# Patient Record
Sex: Female | Born: 1965 | Race: Black or African American | Hispanic: Yes | Marital: Single | Smoking: Current every day smoker
Health system: Southern US, Community
[De-identification: ages and names within clinical notes are randomized; demographics above are authoritative.]

## PROBLEM LIST (undated history)

## (undated) DIAGNOSIS — K219 Gastro-esophageal reflux disease without esophagitis: Secondary | ICD-10-CM

## (undated) DIAGNOSIS — M1990 Unspecified osteoarthritis, unspecified site: Secondary | ICD-10-CM

## (undated) DIAGNOSIS — M797 Fibromyalgia: Secondary | ICD-10-CM

## (undated) DIAGNOSIS — M329 Systemic lupus erythematosus, unspecified: Secondary | ICD-10-CM

## (undated) DIAGNOSIS — D649 Anemia, unspecified: Secondary | ICD-10-CM

## (undated) DIAGNOSIS — D259 Leiomyoma of uterus, unspecified: Secondary | ICD-10-CM

## (undated) DIAGNOSIS — K759 Inflammatory liver disease, unspecified: Secondary | ICD-10-CM

## (undated) HISTORY — PX: IUD REMOVAL: SHX5392

## (undated) HISTORY — PX: PILONIDAL CYST EXCISION: SHX744

## (undated) HISTORY — PX: CHOLECYSTECTOMY: SHX55

---

## 2007-07-05 ENCOUNTER — Ambulatory Visit: Payer: Self-pay | Admitting: Emergency Medicine

## 2007-09-14 ENCOUNTER — Ambulatory Visit: Payer: Self-pay | Admitting: Internal Medicine

## 2007-09-19 ENCOUNTER — Ambulatory Visit: Payer: Self-pay | Admitting: Internal Medicine

## 2010-04-07 ENCOUNTER — Emergency Department: Payer: Self-pay | Admitting: Emergency Medicine

## 2010-09-19 ENCOUNTER — Emergency Department: Payer: Self-pay | Admitting: Unknown Physician Specialty

## 2010-12-22 ENCOUNTER — Ambulatory Visit: Payer: Self-pay | Admitting: Internal Medicine

## 2011-06-09 ENCOUNTER — Emergency Department: Payer: Self-pay | Admitting: Internal Medicine

## 2012-01-28 ENCOUNTER — Ambulatory Visit: Payer: Self-pay

## 2012-08-30 ENCOUNTER — Ambulatory Visit: Payer: Self-pay | Admitting: Family Medicine

## 2014-03-04 ENCOUNTER — Ambulatory Visit: Payer: Self-pay

## 2014-08-27 ENCOUNTER — Emergency Department: Payer: Self-pay | Admitting: Internal Medicine

## 2015-08-03 IMAGING — CR DG KNEE COMPLETE 4+V*L*
1 series · 4 of 4 positions shown · non-contrast
Comparison: None.

CLINICAL DATA: 48-year-old female status post fall while walking up
steps with left knee pain. Initial encounter.

EXAM:
LEFT KNEE - COMPLETE 4+ VIEW

[Series 1: dxr knee lt comp with obliques · 0.14mm/px · 4 of 4 slices shown]
[im 1/4]
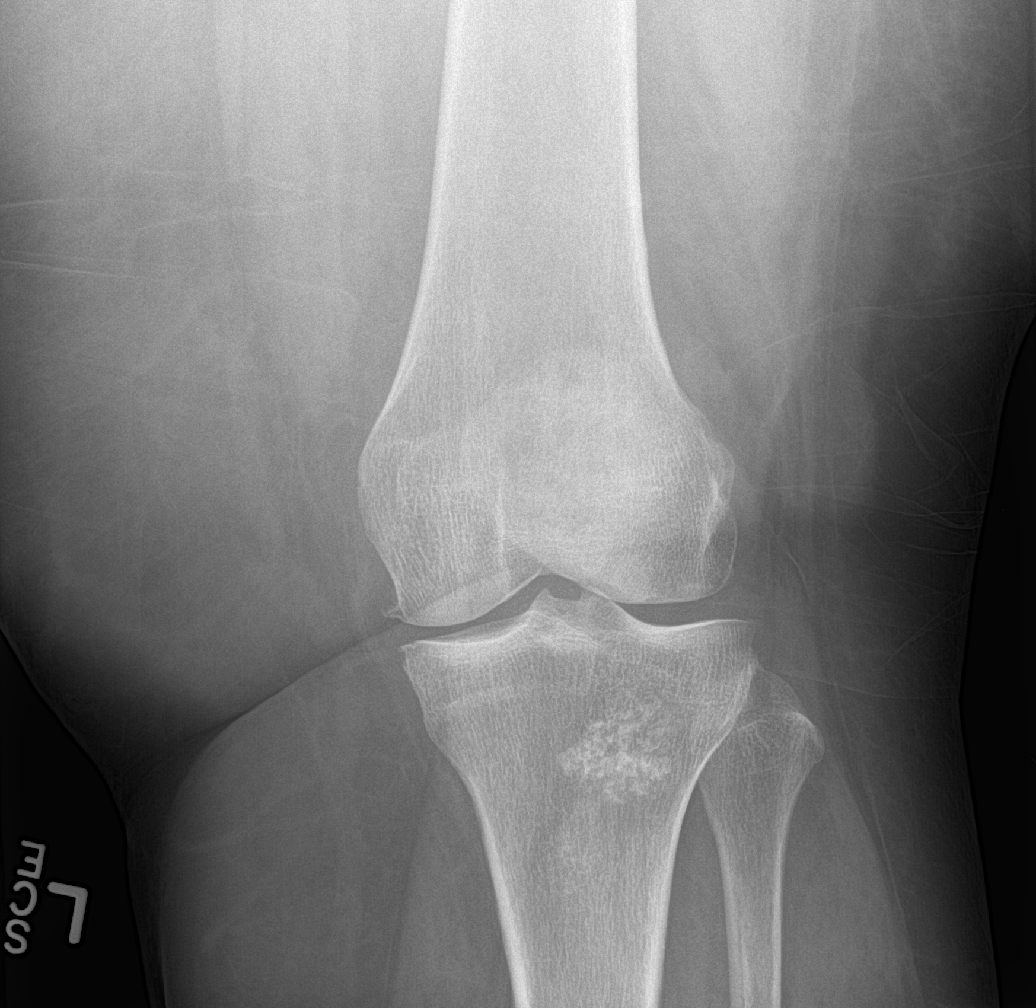
[im 2/4]
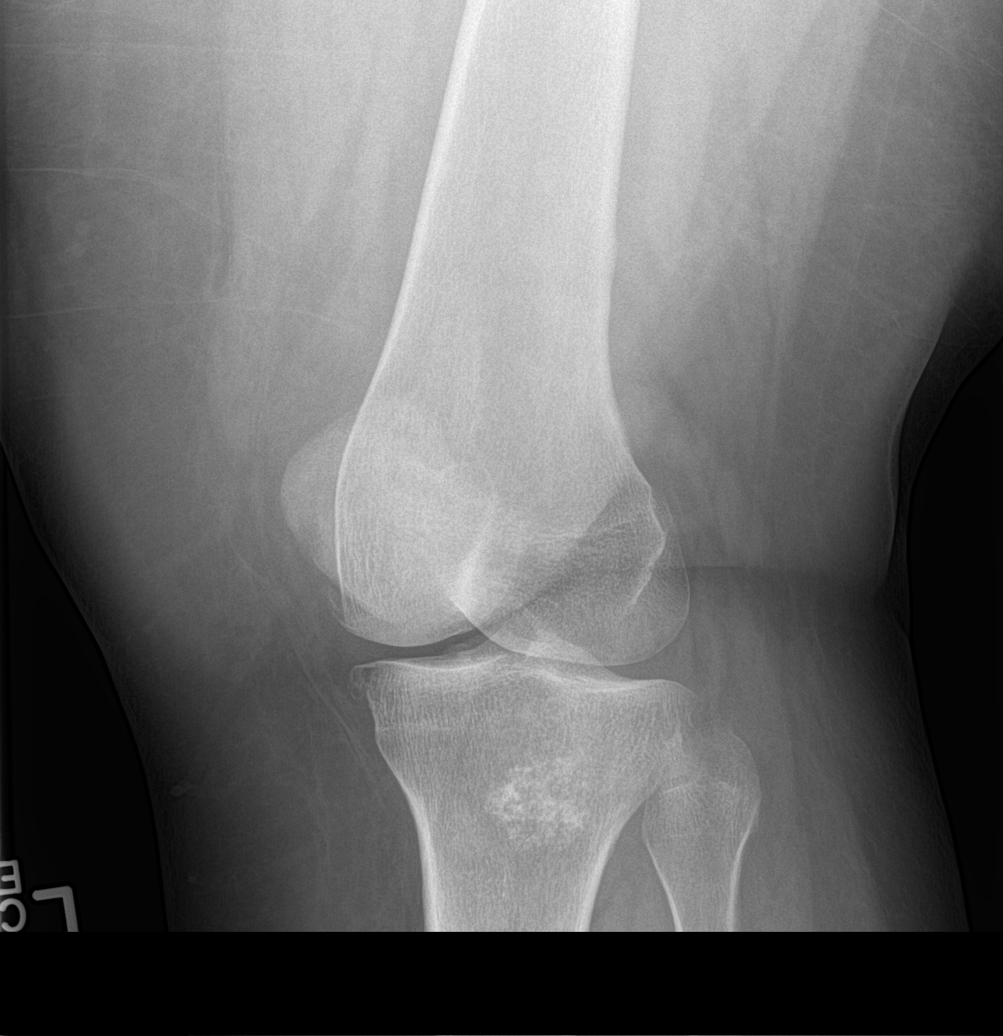
[im 3/4]
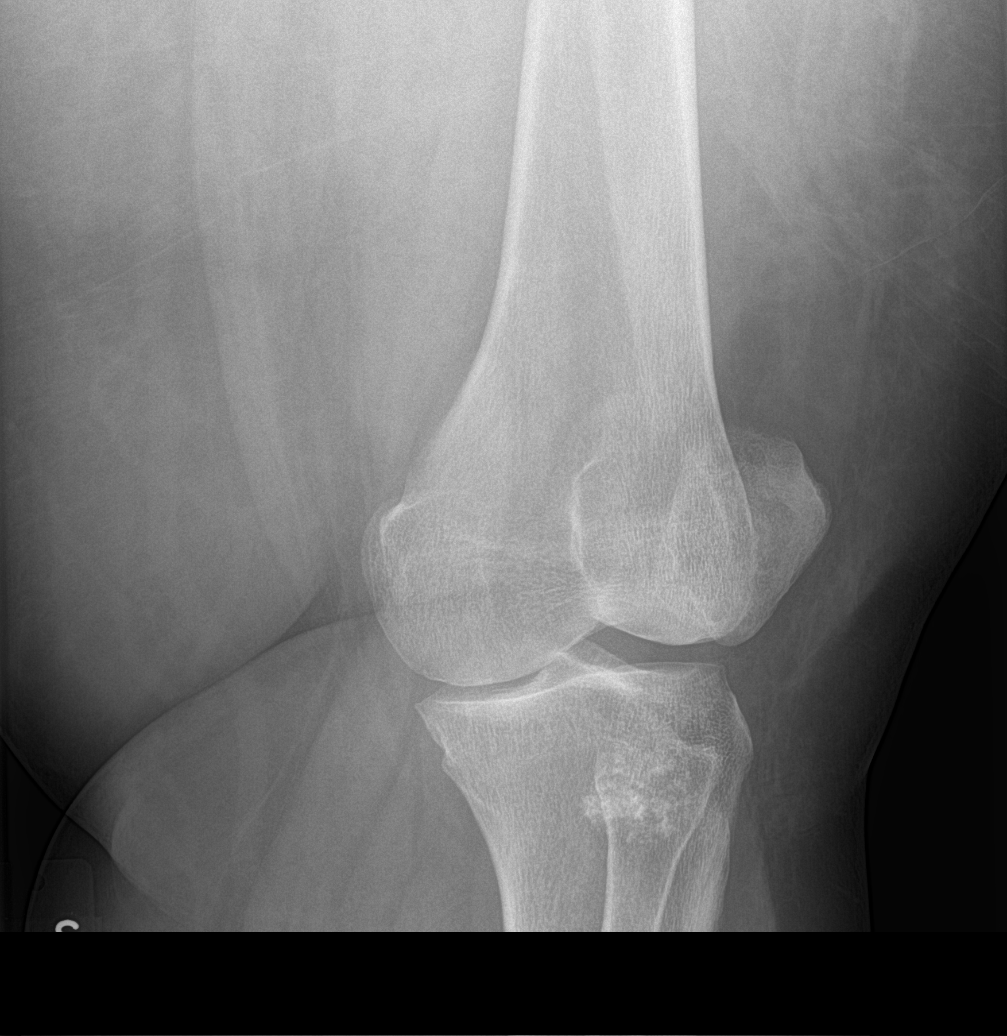
[im 4/4]
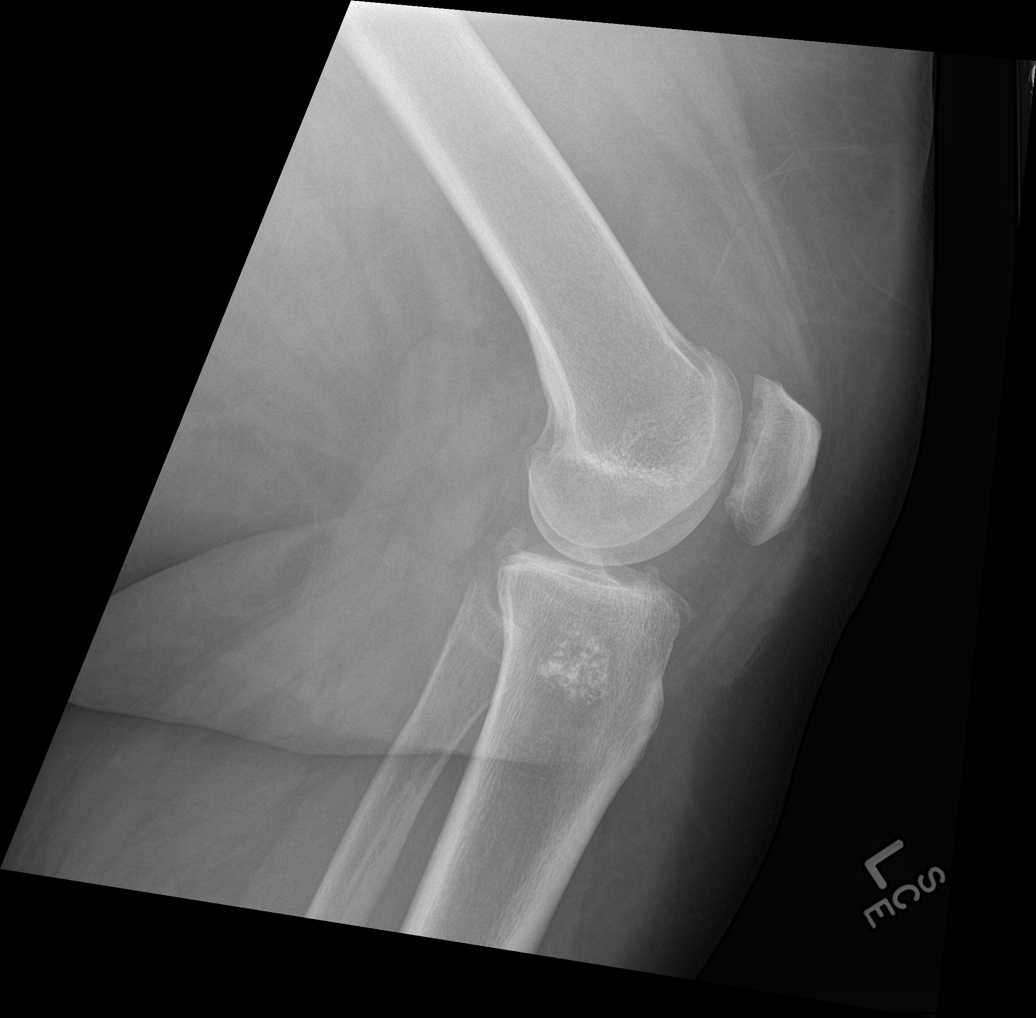

[4 of 4 positions shown; findings below may reference images not displayed]

FINDINGS: Large body habitus. Chondroid appearing sclerosis in the left tibia
meta diaphysis, benign appearance. Otherwise normal bone
mineralization. Patella intact with mild degenerative spurring. No
definite joint effusion. Medial compartment joint space loss with
moderate degenerative spurring. No acute fracture or dislocation.
IMPRESSION: 1.  No acute fracture or dislocation identified about the left knee.
2. Medial and patellofemoral compartment joint degeneration.
3. Benign appearing sclerosis in the proximal tibia probable
enchondroma.

## 2015-11-16 DIAGNOSIS — IMO0002 Reserved for concepts with insufficient information to code with codable children: Secondary | ICD-10-CM

## 2015-11-16 DIAGNOSIS — M329 Systemic lupus erythematosus, unspecified: Secondary | ICD-10-CM

## 2015-11-16 DIAGNOSIS — D649 Anemia, unspecified: Secondary | ICD-10-CM

## 2015-11-16 HISTORY — DX: Systemic lupus erythematosus, unspecified: M32.9

## 2015-11-16 HISTORY — DX: Anemia, unspecified: D64.9

## 2015-11-16 HISTORY — DX: Reserved for concepts with insufficient information to code with codable children: IMO0002

## 2016-01-16 ENCOUNTER — Inpatient Hospital Stay: Admission: RE | Admit: 2016-01-16 | Payer: Self-pay | Source: Ambulatory Visit

## 2016-01-16 ENCOUNTER — Encounter: Payer: Self-pay | Admitting: *Deleted

## 2016-01-16 NOTE — Patient Instructions (Signed)
  Your procedure is scheduled on: 01-20-16 Report to [place] To find out your arrival time please call [phone] between 1PM - 3PM on 01-19-16  Remember: Instructions that are not followed completely may result in serious medical risk, up to and including death, or upon the discretion of your surgeon and anesthesiologist your surgery may need to be rescheduled.    _X___ 1. Do not eat food or drink liquids after midnight. No gum chewing or hard candies.     _X___ 2. No Alcohol for 24 hours before or after surgery.   ____ 3. Bring all medications with you on the day of surgery if instructed.    ____ 4. Notify your doctor if there is any change in your medical condition     (cold, fever, infections).     Do not wear jewelry, make-up, hairpins, clips or nail polish.  Do not wear lotions, powders, or perfumes. You may wear deodorant.  Do not shave 48 hours prior to surgery. Men may shave face and neck.  Do not bring valuables to the hospital.    [facility] is not responsible for any belongings or valuables.               Contacts, dentures or bridgework may not be worn into surgery.  Leave your suitcase in the car. After surgery it may be brought to your room.  For patients admitted to the hospital, discharge time is determined by your treatment team.   Patients discharged the day of surgery will not be allowed to drive home.   Please read over the following fact sheets that you were given:     _X___ Take these medicines the morning of surgery with A SIP OF WATER:    1. CYMBALTA  2. OMEPRAZOLE  3. TAKE AN EXTRA OMEPRAZOLE ON Monday NIGHT (01-19-16) BEFORE BED  4.  5.  6.  ____ Fleet Enema (as directed)   ____ Use CHG Soap as directed  ____ Use inhalers on the day of surgery  ____ Stop metformin 2 days prior to surgery    ____ Take 1/2 of usual insulin dose the night before surgery and none on the morning of surgery.   ____ Stop  Coumadin/Plavix/aspirin-N/A  ____ Stop Anti-inflammatories-NO NSAIDS OR ASA PRODUCTS-TYLENOL OK TO TAKE   ____ Stop supplements until after surgery.    ____ Bring C-Pap to the hospital.

## 2016-01-19 NOTE — Pre-Procedure Instructions (Signed)
Language Race / Ethnicity   [address] [place], [place] [address]  [id number] (Home)  English (Preferred) Black or African American / Not Hispanic or Latino  Reason for Referral Incoming Referral Reason Specialty Diagnoses / Procedures Referred By Contact Referred To Contact  [place] Access / [place] (Routine)    Procedures  Exercise Treadmill Test  Cay Schillings, FNP  [address]  [place], [place] [address]  Phone: [phone]  Fax: [phone]      Outgoing Referral Reason Specialty Diagnoses / Procedures Referred By Contact Referred To Contact  [place] Access / [place] (Routine)    Procedures  Exercise Treadmill Test  Cay Schillings, [place]  [place], [place] [address]  Phone: [phone]  Fax: [place] / [place] (Routine)    Procedures  Exercise Treadmill Test  Cay Schillings, [place]  [place], [place] [address]  Phone: [phone]  Fax: [phone]     Encounter Details Date Type Department Care Team Description  06/25/2015 [facility] Encounter IMG STRESS HBR  [address], [place], [place], [place]  [id number]  [place], [place] [address]  [id number]  [phone] (Fax)    Medications at Time of Discharge Medication Sig. Disp. Refills Start Date End Date  cyclobenzaprine (FLEXERIL) 10 MG tablet Take 1 tablet (10 mg total) by mouth two (2) times a day as needed for muscle spasms. 12 tablet  0 06/20/2015   Discharge Disposition Disposition Code [place] with Self Care     Plan of Care Not on file  Procedures Procedure Name Priority Date/Time Associated Diagnosis Comments  STRESS TEST, EXERCISE TREADMILL TEST Routine 06/25/2015 8:51 AM EDT  Results for this procedure are in the results section.   Imaging Results Exercise Treadmill Test - Final result (06/25/2015 8:51 AM) Exercise  Treadmill Test - Final result (06/25/2015 8:51 AM)  Component Value Range  ST Depression  mm  Angina Score for [facility] Treadmill Score 0   Exercise duration 1.0 min  ST Deviation 0.0 mm  Duke Treadmill Score 1   Baseline HR 100 bpm  Baseline BP 124/67 mmHg  Baseline O2 sat  %  Peak HR 133 bpm  Percent target HR 77 %  Peak BP 146/65 mmHg  Peak O2 sat  %  Target HR 132.44 bpm  Exercise duration (min) 1 min  Exercise duration (sec) 16 sec  Stage Reached 1   Estimated workload 4.6 METS   Exercise Treadmill Test - Final result (06/25/2015 8:51 AM)  Narrative   Non-diagnostic ETT due to sub max stress   No significant ST segment changes or arrhythmias were noted during   stress     Visit Diagnoses Not on file Document Information Primary Care Provider [facility] (Aug. 05, 2016 - Present) [phone] (Work) [phone] (Fax)  [place], [place] [address] Document Coverage Dates Aug. 10, 2016 - Aug. 10, 2016 [place] [address] [place], [place] [address] Encounter Providers [facility] STRESS RM 1 (Attending) Cay Schillings (Admitting) [phone] (Work) [phone] (Fax)  [address] [phone] [place], [place] [address]

## 2016-01-19 NOTE — Pre-Procedure Instructions (Signed)
[place]  Component Name Value Range  EKG Ventricular Rate 79 BPM   EKG Atrial Rate 79 BPM   EKG P-R Interval 162 ms  EKG QRS Duration 88 ms  EKG Q-T Interval 364 ms  EKG QTC Calculation 417 ms  EKG Calculated P Axis 61 degrees   EKG Calculated R Axis 15 degrees   EKG Calculated T Axis 0 degrees    Result Narrative  NORMAL SINUS RHYTHM ABNORMAL ECG WHEN COMPARED WITH ECG OF 20-Jun-2015 14:23, NO SIGNIFICANT CHANGE WAS FOUND Confirmed by [place] ([id number]) on 07/06/2015 3:57:45 PM   Status Results Details   Encounter Summary

## 2016-02-03 ENCOUNTER — Ambulatory Visit
Admission: RE | Admit: 2016-02-03 | Payer: Medicaid Other | Source: Ambulatory Visit | Admitting: Obstetrics and Gynecology

## 2016-02-03 ENCOUNTER — Encounter: Payer: Self-pay | Admitting: Certified Registered Nurse Anesthetist

## 2016-02-03 HISTORY — DX: Fibromyalgia: M79.7

## 2016-02-03 HISTORY — DX: Inflammatory liver disease, unspecified: K75.9

## 2016-02-03 HISTORY — DX: Anemia, unspecified: D64.9

## 2016-02-03 HISTORY — DX: Gastro-esophageal reflux disease without esophagitis: K21.9

## 2016-02-03 HISTORY — DX: Systemic lupus erythematosus, unspecified: M32.9

## 2016-02-03 HISTORY — DX: Unspecified osteoarthritis, unspecified site: M19.90

## 2016-02-03 SURGERY — DILATATION AND CURETTAGE /HYSTEROSCOPY
Anesthesia: Choice

## 2016-02-03 MED ORDER — FERRIC SUBSULFATE 259 MG/GM EX SOLN
CUTANEOUS | Status: AC
  Filled 2016-02-03: qty 8

## 2016-02-19 ENCOUNTER — Other Ambulatory Visit: Payer: Medicaid Other

## 2016-02-23 ENCOUNTER — Encounter: Payer: Self-pay | Admitting: *Deleted

## 2016-02-23 ENCOUNTER — Other Ambulatory Visit: Payer: Medicaid Other

## 2016-02-24 ENCOUNTER — Encounter: Payer: Self-pay | Admitting: *Deleted

## 2016-02-24 NOTE — Patient Instructions (Signed)
  Your procedure is scheduled on:02/26/16 Report to Day Surgery.  [facility] SECOND FLOOR To find out your arrival time please call [phone] between 1PM - 3PM on 02/25/16  Remember: Instructions that are not followed completely may result in serious medical risk, up to and including death, or upon the discretion of your surgeon and anesthesiologist your surgery may need to be rescheduled.    _X___ 1. Do not eat food or drink liquids after midnight. No gum chewing or hard candies.     __X__ 2. No Alcohol for 24 hours before or after surgery.   ____ 3. Bring all medications with you on the day of surgery if instructed.    _X___ 4. Notify your doctor if there is any change in your medical condition     (cold, fever, infections).     Do not wear jewelry, make-up, hairpins, clips or nail polish.  Do not wear lotions, powders, or perfumes. You may wear deodorant.  Do not shave 48 hours prior to surgery. Men may shave face and neck.  Do not bring valuables to the hospital.    [facility] is not responsible for any belongings or valuables.               Contacts, dentures or bridgework may not be worn into surgery.  Leave your suitcase in the car. After surgery it may be brought to your room.  For patients admitted to the hospital, discharge time is determined by your                treatment team.   Patients discharged the day of surgery will not be allowed to drive home.   Please read over the following fact sheets that you were given:   Surgical Site Infection Prevention   ____ Take these medicines the morning of surgery with A SIP OF WATER:    1. CYMBALTA  2. OMEPRAZOLE AT BEDTIME NIGHT BEFORE SURGERY AND AM SURGERY  3.   4.  5.  6.  ____ Fleet Enema (as directed)   ____ Use CHG Soap as directed  ____ Use inhalers on the day of surgery  ____ Stop metformin 2 days prior to surgery    ____ Take 1/2 of usual insulin dose the night before surgery and none on the morning  of surgery.   ____ Stop Coumadin/Plavix/aspirin on   ____ Stop Anti-inflammatories on   ____ Stop supplements until after surgery.    ____ Bring C-Pap to the hospital.

## 2016-02-24 NOTE — Pre-Procedure Instructions (Addendum)
PATIENT STATES SOME WEAKNESS AND MEMORY ISSUUES AND DR BOCK REFERRING TO NEURO. SPOKE WITH DR BOCK WHO STATED AND WILL FAX NOTE CLEARING FOR SURGERY PRIOR TO NEURO EVAL DR Kayleen Memos NOTIFIED

## 2016-02-26 ENCOUNTER — Ambulatory Visit: Payer: Medicaid Other | Admitting: Anesthesiology

## 2016-02-26 ENCOUNTER — Encounter: Payer: Self-pay | Admitting: *Deleted

## 2016-02-26 ENCOUNTER — Encounter
Admission: RE | Disposition: A | Discharge status: Home / Self Care | Payer: Self-pay | Source: Ambulatory Visit | Attending: Obstetrics and Gynecology

## 2016-02-26 ENCOUNTER — Ambulatory Visit
Admission: RE | Admit: 2016-02-26 | Discharge: 2016-02-26 | Disposition: A | Discharge status: Home / Self Care | Payer: Medicaid Other | Source: Ambulatory Visit | Attending: Obstetrics and Gynecology | Admitting: Obstetrics and Gynecology

## 2016-02-26 DIAGNOSIS — K219 Gastro-esophageal reflux disease without esophagitis: Secondary | ICD-10-CM | POA: Insufficient documentation

## 2016-02-26 DIAGNOSIS — N920 Excessive and frequent menstruation with regular cycle: Secondary | ICD-10-CM | POA: Insufficient documentation

## 2016-02-26 DIAGNOSIS — R938 Abnormal findings on diagnostic imaging of other specified body structures: Secondary | ICD-10-CM | POA: Insufficient documentation

## 2016-02-26 DIAGNOSIS — Z8041 Family history of malignant neoplasm of ovary: Secondary | ICD-10-CM | POA: Diagnosis not present

## 2016-02-26 DIAGNOSIS — Z881 Allergy status to other antibiotic agents status: Secondary | ICD-10-CM | POA: Insufficient documentation

## 2016-02-26 DIAGNOSIS — Z800 Family history of malignant neoplasm of digestive organs: Secondary | ICD-10-CM | POA: Diagnosis not present

## 2016-02-26 DIAGNOSIS — Z882 Allergy status to sulfonamides status: Secondary | ICD-10-CM | POA: Diagnosis not present

## 2016-02-26 DIAGNOSIS — Z880 Allergy status to penicillin: Secondary | ICD-10-CM | POA: Diagnosis not present

## 2016-02-26 DIAGNOSIS — Z9049 Acquired absence of other specified parts of digestive tract: Secondary | ICD-10-CM | POA: Insufficient documentation

## 2016-02-26 DIAGNOSIS — Z79899 Other long term (current) drug therapy: Secondary | ICD-10-CM | POA: Diagnosis not present

## 2016-02-26 DIAGNOSIS — Z6841 Body Mass Index (BMI) 40.0 and over, adult: Secondary | ICD-10-CM | POA: Insufficient documentation

## 2016-02-26 DIAGNOSIS — Z803 Family history of malignant neoplasm of breast: Secondary | ICD-10-CM | POA: Diagnosis not present

## 2016-02-26 DIAGNOSIS — F17200 Nicotine dependence, unspecified, uncomplicated: Secondary | ICD-10-CM | POA: Insufficient documentation

## 2016-02-26 DIAGNOSIS — M797 Fibromyalgia: Secondary | ICD-10-CM | POA: Insufficient documentation

## 2016-02-26 DIAGNOSIS — M1990 Unspecified osteoarthritis, unspecified site: Secondary | ICD-10-CM | POA: Insufficient documentation

## 2016-02-26 HISTORY — PX: HYSTEROSCOPY WITH D & C: SHX1775

## 2016-02-26 HISTORY — PX: INTRAUTERINE DEVICE (IUD) INSERTION: SHX5877

## 2016-02-26 LAB — HCG, QUANTITATIVE, PREGNANCY

## 2016-02-26 SURGERY — DILATATION AND CURETTAGE /HYSTEROSCOPY
Anesthesia: General

## 2016-02-26 MED ORDER — HYDROCODONE-ACETAMINOPHEN 5-325 MG PO TABS: 1.0000 | ORAL_TABLET | Freq: Four times a day (QID) | ORAL | Status: AC | PRN

## 2016-02-26 MED ORDER — FERRIC SUBSULFATE 259 MG/GM EX SOLN
CUTANEOUS | Status: AC
  Filled 2016-02-26: qty 8

## 2016-02-26 MED ORDER — ONDANSETRON HCL 4 MG/2ML IJ SOLN: 4.0000 mg | Freq: Once | INTRAMUSCULAR | Status: DC | PRN

## 2016-02-26 MED ORDER — LIDOCAINE HCL (CARDIAC) 20 MG/ML IV SOLN
INTRAVENOUS | Status: DC | PRN
  Administered 2016-02-26: 50 mg via INTRAVENOUS

## 2016-02-26 MED ORDER — LACTATED RINGERS IV SOLN
INTRAVENOUS | Status: DC
  Administered 2016-02-26: 50 mL/h via INTRAVENOUS

## 2016-02-26 MED ORDER — FENTANYL CITRATE (PF) 100 MCG/2ML IJ SOLN: 25.0000 ug | INTRAMUSCULAR | Status: DC | PRN

## 2016-02-26 MED ORDER — ONDANSETRON HCL 4 MG/2ML IJ SOLN
INTRAMUSCULAR | Status: DC | PRN
  Administered 2016-02-26: 4 mg via INTRAVENOUS

## 2016-02-26 MED ORDER — KETAMINE HCL 50 MG/ML IJ SOLN
INTRAMUSCULAR | Status: DC | PRN
  Administered 2016-02-26: 50 mg via INTRAVENOUS

## 2016-02-26 MED ORDER — SUCCINYLCHOLINE CHLORIDE 20 MG/ML IJ SOLN
INTRAMUSCULAR | Status: DC | PRN
  Administered 2016-02-26: 120 mg via INTRAVENOUS

## 2016-02-26 MED ORDER — PHENYLEPHRINE HCL 10 MG/ML IJ SOLN
INTRAMUSCULAR | Status: DC | PRN
  Administered 2016-02-26 (×2): 100 ug via INTRAVENOUS

## 2016-02-26 MED ORDER — IBUPROFEN 600 MG PO TABS: 600.0000 mg | ORAL_TABLET | Freq: Four times a day (QID) | ORAL | Status: AC | PRN

## 2016-02-26 MED ORDER — PROPOFOL 10 MG/ML IV BOLUS
INTRAVENOUS | Status: DC | PRN
Start: 2016-02-26 — End: 2016-02-26
  Administered 2016-02-26: 200 mg via INTRAVENOUS
  Administered 2016-02-26: 50 mg via INTRAVENOUS

## 2016-02-26 MED ORDER — GLYCOPYRROLATE 0.2 MG/ML IJ SOLN
INTRAMUSCULAR | Status: DC | PRN
  Administered 2016-02-26: 0.2 mg via INTRAVENOUS

## 2016-02-26 MED ORDER — FENTANYL CITRATE (PF) 100 MCG/2ML IJ SOLN
INTRAMUSCULAR | Status: DC | PRN
  Administered 2016-02-26 (×2): 50 ug via INTRAVENOUS

## 2016-02-26 MED ORDER — MIDAZOLAM HCL 2 MG/2ML IJ SOLN
INTRAMUSCULAR | Status: DC | PRN
  Administered 2016-02-26: 2 mg via INTRAVENOUS

## 2016-02-26 SURGICAL SUPPLY — 15 items
CATH ROBINSON RED A/P 16FR (CATHETERS) ×3 IMPLANT
ELECT REM PT RETURN 9FT ADLT (ELECTROSURGICAL) ×3
ELECTRODE REM PT RTRN 9FT ADLT (ELECTROSURGICAL) ×1 IMPLANT
GLOVE BIO SURGEON STRL SZ7 (GLOVE) ×3 IMPLANT
GLOVE INDICATOR 7.5 STRL GRN (GLOVE) ×3 IMPLANT
GOWN STRL REUS W/ TWL LRG LVL3 (GOWN DISPOSABLE) ×2 IMPLANT
GOWN STRL REUS W/TWL LRG LVL3 (GOWN DISPOSABLE) ×6
IV LACTATED RINGERS 1000ML (IV SOLUTION) ×3 IMPLANT
KIT RM TURNOVER CYSTO AR (KITS) ×3 IMPLANT
PACK DNC HYST (MISCELLANEOUS) ×3 IMPLANT
PAD OB MATERNITY 4.3X12.25 (PERSONAL CARE ITEMS) ×3 IMPLANT
PAD PREP 24X41 OB/GYN DISP (PERSONAL CARE ITEMS) ×3 IMPLANT
TOWEL OR 17X26 4PK STRL BLUE (TOWEL DISPOSABLE) ×3 IMPLANT
TUBING CONNECTING 10 (TUBING) ×2 IMPLANT
TUBING CONNECTING 10' (TUBING) ×1

## 2016-02-26 NOTE — Anesthesia Preprocedure Evaluation (Addendum)
Anesthesia Evaluation  Patient identified by MRN, date of birth, ID band Patient awake    Reviewed: Allergy & Precautions, NPO status , Patient's Chart, lab work & pertinent test results  History of Anesthesia Complications Negative for: history of anesthetic complications  Airway Mallampati: III  TM Distance: >3 FB Neck ROM: Full    Dental  (+) Chipped   Pulmonary neg pulmonary ROS, Current Smoker,    Pulmonary exam normal breath sounds clear to auscultation       Cardiovascular negative cardio ROS Normal cardiovascular exam     Neuro/Psych    GI/Hepatic GERD  ,(+) Hepatitis -, A  Endo/Other  Morbid obesity  Renal/GU      Musculoskeletal  (+) Fibromyalgia -  Abdominal (+) + obese,   Peds  Hematology  (+) anemia ,   Anesthesia Other Findings Morbid obesity  Reproductive/Obstetrics                            Anesthesia Physical Anesthesia Plan  ASA: III  Anesthesia Plan: General   Post-op Pain Management:    Induction: Intravenous  Airway Management Planned: Oral ETT  Additional Equipment:   Intra-op Plan:   Post-operative Plan: Extubation in OR  Informed Consent: I have reviewed the patients History and Physical, chart, labs and discussed the procedure including the risks, benefits and alternatives for the proposed anesthesia with the patient or authorized representative who has indicated his/her understanding and acceptance.     Plan Discussed with:   Anesthesia Plan Comments:        Anesthesia Quick Evaluation

## 2016-02-26 NOTE — Anesthesia Postprocedure Evaluation (Signed)
Anesthesia Post Note  Patient: Haley Jackson  Procedure(s) Performed: Procedure(s) (LRB): DILATATION AND CURETTAGE /HYSTEROSCOPY (N/A) INTRAUTERINE DEVICE (IUD) INSERTION (N/A)  Patient location during evaluation: PACU Anesthesia Type: General Level of consciousness: awake and alert and oriented Pain management: pain level controlled Vital Signs Assessment: post-procedure vital signs reviewed and stable Respiratory status: spontaneous breathing Cardiovascular status: blood pressure returned to baseline Anesthetic complications: no    Last Vitals:  Filed Vitals:   02/26/16 1750 02/26/16 1803  BP:  139/68  Pulse: 90 85  Temp:  36.7 C  Resp: 27 20    Last Pain: There were no vitals filed for this visit.               Haley Jackson

## 2016-02-26 NOTE — Discharge Instructions (Signed)

## 2016-02-26 NOTE — Op Note (Signed)
Patient Name: Haley Jackson Date of Procedure: 02/26/2016  Preoperative Diagnosis: 1) 50 y.o. with menorrhagia  2) Morbid obesity  Postoperative Diagnosis: 1) 50 y.o. with menorrhagia 2) Morbid obesity  Operation Performed: Hysteroscopy, dilation and curettage, Mirena IUD placement Indication: Menorrhagia  Anesthesia: General  Primary Surgeon: Malachy Mood, MD  Assistant: none  Preoperative Antibiotics: none  Estimated Blood Loss: 40mL  Drains or Tubes: none  Implants: none  Specimens Removed: endometrial curettings  Complications: none  Intraoperative Findings:  Thickened endometrial lining  Patient Condition: stable  Procedure in Detail:  Patient was taken to the operating room were she was administered general endotracheal anesthesia.  She was positioned in the dorsal lithotomy position utilizing Allen stirups, prepped and draped in the usual sterile fashion.  Prior to proceeding with the case a time out was performed.  Attention was turned to the patient's pelvis.  A red rubber catheter was used to empty the patient's bladder.  An operative speculum was placed to allow visualization of the cervix.  The anterior lip of the cervix was grasped with a single tooth tenaculum and was already sufficiently dilated to proceed with hysteroscopy without the need for dilation.  The hysteroscope was then advanced into the uterine cavity noting the above findings.  Sharp curettage was performed and the resulting specimen collected and sent to pathology.    The uterus was sounded to 9cm, the Mirena IUD was inserted in the usual fashion, strings cut 4cm in length.  The single tooth tenaculum was removed from the cervix.  The tenaculum sites and cervix were noted to be  Hemostatic before removing the operative speculum.  Sponge needle and instrument counts were corrects times two.  The patient tolerated the procedure well and was taken to the recovery room in stable condition.

## 2016-02-26 NOTE — Progress Notes (Signed)
Coughing  A lot  States she has seasonal allergies

## 2016-02-26 NOTE — Anesthesia Procedure Notes (Signed)
Procedure Name: Intubation Performed by: Rolla Plate Pre-anesthesia Checklist: Patient identified, Patient being monitored, Timeout performed, Emergency Drugs available and Suction available Patient Re-evaluated:Patient Re-evaluated prior to inductionOxygen Delivery Method: Circle system utilized Preoxygenation: Pre-oxygenation with 100% oxygen Intubation Type: IV induction and Rapid sequence Laryngoscope Size: Miller and 2 Grade View: Grade I Tube type: Oral Tube size: 7.0 mm Number of attempts: 1 Airway Equipment and Method: Stylet and Patient positioned with wedge pillow Placement Confirmation: ETT inserted through vocal cords under direct vision,  positive ETCO2 and breath sounds checked- equal and bilateral Secured at: 21 cm Tube secured with: Tape Dental Injury: Teeth and Oropharynx as per pre-operative assessment

## 2016-02-26 NOTE — H&P (Signed)
Initial clinic paper H&P on chart  History reviewed, patient examined, no change in status, stable for surgery.

## 2016-02-26 NOTE — OR Nursing (Signed)
Mirena, IUD, inserted today.

## 2016-02-26 NOTE — Transfer of Care (Signed)
Immediate Anesthesia Transfer of Care Note  Patient: Haley Jackson  Procedure(s) Performed: Procedure(s): DILATATION AND CURETTAGE /HYSTEROSCOPY (N/A) INTRAUTERINE DEVICE (IUD) INSERTION (N/A)  Patient Location: PACU  Anesthesia Type:General  Level of Consciousness: awake and alert   Airway & Oxygen Therapy: Patient Spontanous Breathing and Patient connected to face mask oxygen  Post-op Assessment: Report given to RN  Post vital signs: Reviewed  Last Vitals:  Filed Vitals:   02/26/16 1716  BP: 94/75  Pulse: 111  Resp: 18    Complications: No apparent anesthesia complications

## 2016-02-27 ENCOUNTER — Encounter: Payer: Self-pay | Admitting: Obstetrics and Gynecology

## 2016-03-01 LAB — SURGICAL PATHOLOGY

## 2016-04-21 ENCOUNTER — Encounter: Payer: Self-pay | Admitting: Emergency Medicine

## 2016-04-21 ENCOUNTER — Ambulatory Visit
Admission: EM | Admit: 2016-04-21 | Discharge: 2016-04-21 | Disposition: A | Discharge status: Hospice | Payer: Medicaid Other | Attending: Family Medicine | Admitting: Family Medicine

## 2016-04-21 DIAGNOSIS — R1031 Right lower quadrant pain: Secondary | ICD-10-CM | POA: Diagnosis not present

## 2016-04-21 NOTE — ED Provider Notes (Signed)
[facility]  ____________________________________________  Time seen: Approximately 11:20 AM  I have reviewed the triage vital signs and the nursing notes.  HISTORY  Chief Complaint Abdominal Pain   HPI Haley Jackson is a 50 y.o. female presents with daughter at bedside for the complaints of right lower abdominal pain. Patient reports at the end of April she had a D&C for abnormal and continued uterine bleeding. Patient reports a that time she then had a Mirena inserted. Patient reports "my body rejected the Mirena "and she then had the Mirena removed a few weeks later as her pain and bleeding had continued at the same rate. Patient reports that her OB/GYN then started her on oral Provera in which started to help the bleeding but has not stopped the bleeding. Patient reports that she has been on oral Provera for the last several weeks with continued vaginal bleeding as well as continued passing blood clots. Patient reports that she has had continued lower abdominal pain throughout this time frame over the last month and a half reports the last 4 days her pain has increased. Patient reports also in the last 4 days her pain has began to localize more on the right lower abdomen as well as she reports that she has had a fever at home. Patient states that when she measured her fever at home is 100 orally even after taking ibuprofen. Patient reports that she has been taking 1200 mg of ibuprofen twice a day. Patient reports pain is 10 out of 10 and states that the pain feels like labor pains. Denies any alleviating factors. Patient also reports some nausea accompanying fevers denies vomiting. Denies diarrhea.  Denies pain radiation. Denies dysuria. Patient reports continued vaginal bleeding at about the same rate that has been present over the last month with intermittent blood clots. Patient describes bleeding as moderate. Denies fall or trauma. Reports some low back pain but states that she  feels it is from her fibromyalgia. Denies any other medication changes. Patient reports she is not sure exactly why she has been having the uterine bleeding but continue to follow with OB/GYN to determine. Reports history of cholecystectomy denies other abdominal surgeries other than what was mentioned.   Denies chest pain, shortness of breath, dysuria, weakness, fall, extremity swelling, extremity pain. Denies stool changes, blood in stool, dark stool or blood in toilet. Reports continues to drink fluids well.  OB/GYN: Georgianne Fick   Past Medical History  Diagnosis Date  . Arthritis   . Lupus ([place]) 11/2015    POSSIBLE LUPUS AND MIXED CONNECTIVE TISSUE DISORDER-PT STATES THAT SHE IS IN THE PROCESS OF BEING TESTED FOR THESE(01-16-16)  BUT WAS RECENTLY DX WITH FIBROMYALGIA  . Hepatitis     YEARS AGO AFTER EATING CONTAMINATED FOOD  . Fibromyalgia   . Anemia 2017    due to recent bleeding and an ulcer  . GERD (gastroesophageal reflux disease)     11/2015 diagnosed with bleeding ulcer....taking prilosec    There are no active problems to display for this patient.   Past Surgical History  Procedure Laterality Date  . Cholecystectomy    . Pilonidal cyst excision    . Hysteroscopy w/d&c N/A 02/26/2016    Procedure: DILATATION AND CURETTAGE /HYSTEROSCOPY;  Surgeon: Malachy Mood, MD;  Location: [facility] ORS;  Service: Gynecology;  Laterality: N/A;  . Intrauterine device (iud) insertion N/A 02/26/2016    Procedure: INTRAUTERINE DEVICE (IUD) INSERTION;  Surgeon: Malachy Mood, MD;  Location: [facility] ORS;  Service: Gynecology;  Laterality: N/A;  . Iud removal      Current Outpatient Rx  Name  Route  Sig  Dispense  Refill  . DULoxetine (CYMBALTA) 30 MG capsule   Oral   Take 30 mg by mouth every morning.         Marland Kitchen ibuprofen (ADVIL,MOTRIN) 600 MG tablet   Oral   Take 1 tablet (600 mg total) by mouth every 6 (six) hours as needed.   60 tablet   3   . IRON PO   Oral   Take 1 tablet by mouth  daily.         Marland Kitchen omeprazole (PRILOSEC) 20 MG capsule   Oral   Take 20 mg by mouth every morning.         . Cholecalciferol (VITAMIN D PO)   Oral   Take 1 tablet by mouth once a week.                                            Allergies Ampicillin; Penicillins; Tetracyclines & related; and Sulfa antibiotics  History reviewed. No pertinent family history.  Social History Social History  Substance Use Topics  . Smoking status: Current Every Day Smoker -- 0.25 packs/day for 30 years    Types: Cigarettes  . Smokeless tobacco: None  . Alcohol Use: Yes     Comment: OCC    Review of Systems Constitutional: No fever/chills Eyes: No visual changes. ENT: No sore throat. Cardiovascular: Denies chest pain. Respiratory: Denies shortness of breath. Gastrointestinal:As above. No diarrhea.  No constipation. Genitourinary: Negative for dysuria. Musculoskeletal: Negative for back pain. Skin: Negative for rash. Neurological: Negative for headaches, focal weakness or numbness.  10-point ROS otherwise negative.  ____________________________________________   PHYSICAL EXAM:  VITAL SIGNS: ED Triage Vitals  Enc Vitals Group     BP 04/21/16 1044 156/91 mmHg     Pulse Rate 04/21/16 1044 86     Resp 04/21/16 1044 20     Temp 04/21/16 1044 98.7 F (37.1 C)     Temp Source 04/21/16 1044 Oral     SpO2 04/21/16 1044 99 %     Weight 04/21/16 1044 351 lb (159.213 kg)     Height 04/21/16 1044 5' 4.5" (1.638 m)     Head Cir --      Peak Flow --      Pain Score 04/21/16 1049 7     Pain Loc --      Pain Edu? --      Excl. in [place]? --     Constitutional: Alert and oriented. Well appearing.Upon entering room patient standing leaning over exam table as she reports that helped with her pain.  Eyes: Conjunctivae are normal. PERRL. EOMI. Head: Atraumatic.  Mouth/Throat: Mucous membranes are moist.  Oropharynx non-erythematous. Neck: No stridor.  No cervical spine tenderness to  palpation. Hematological/Lymphatic/Immunilogical: No cervical lymphadenopathy. Cardiovascular: Normal rate, regular rhythm. Grossly normal heart sounds.  Good peripheral circulation. Respiratory: Normal respiratory effort.  No retractions. Lungs CTAB.No wheezes, rales or rhonchi. Gastrointestinal: Soft. Moderate tenderness right lower quadrant and right suprapubic. Abdomen otherwise soft and nontender. Obese abdomen. Normal Bowel sounds.  No CVA tenderness. Musculoskeletal: No lower or upper extremity tenderness nor edema. No cervical, thoracic or lumbar tenderness to palpation. Bilateral pedal pulses equal and easily palpated.  Neurologic:  Normal speech and language. No gross focal neurologic  deficits are appreciated. No gait instability. Skin:  Skin is warm, dry and intact. No rash noted. Psychiatric: Mood and affect are normal. Speech and behavior are normal.  ____________________________________________   LABS (all labs ordered are listed, but only abnormal results are displayed)  Labs Reviewed - No data to display   INITIAL IMPRESSION / ASSESSMENT AND PLAN / ED COURSE  Pertinent labs & imaging results that were available during my care of the patient were reviewed by me and considered in my medical decision making (see chart for details).  Overall well-appearing patient. Presents for the complaints of abdominal pain that has increased over the last 4 days with accompanying fever per patient, nausea and continued vaginal bleeding. Recent D&C as well as Mirena insertion and removal. As with patient's recent uterine procedures, continued vaginal bleeding, abdominal pain which is right lower quadrant, subjective fevers recommended patient be seen in emergency room of her choice at this time. Discussed with patient evaluation of labs in urgent care, and patient opted to wait to have labs done until in the emergency room. Patient alert and oriented with decisional capacity and states that her  daughter who is at bedside will drive her to the ER. Patient stable at the time of transfer discharge. Patient reports she will go to [facility] [employer] called report.   Discussed follow up with Primary care physician this week. Discussed follow up and return parameters including no resolution or any worsening concerns. Patient verbalized understanding and agreed to plan.   ____________________________________________   FINAL CLINICAL IMPRESSION(S) / ED DIAGNOSES  Final diagnoses:  Right lower quadrant abdominal pain     Discharge Medication List as of 04/21/2016 11:13 AM      Note: This dictation was prepared with Dragon dictation along with smaller phrase technology. Any transcriptional errors that result from this process are unintentional.      Marylene Land, NP 04/21/16 1218

## 2016-04-21 NOTE — Discharge Instructions (Signed)
Go directly to emergency room as discussed.  ° °Abdominal Pain, Adult °Many things can cause abdominal pain. Usually, abdominal pain is not caused by a disease and will improve without treatment. It can often be observed and treated at home. Your health care provider will do a physical exam and possibly order blood tests and X-rays to help determine the seriousness of your pain. However, in many cases, more time must pass before a clear cause of the pain can be found. Before that point, your health care provider may not know if you need more testing or further treatment. °HOME CARE INSTRUCTIONS °Monitor your abdominal pain for any changes. The following actions may help to alleviate any discomfort you are experiencing: °· Only take over-the-counter or prescription medicines as directed by your health care provider. °· Do not take laxatives unless directed to do so by your health care provider. °· Try a clear liquid diet (broth, tea, or water) as directed by your health care provider. Slowly move to a bland diet as tolerated. °SEEK MEDICAL CARE IF: °· You have unexplained abdominal pain. °· You have abdominal pain associated with nausea or diarrhea. °· You have pain when you urinate or have a bowel movement. °· You experience abdominal pain that wakes you in the night. °· You have abdominal pain that is worsened or improved by eating food. °· You have abdominal pain that is worsened with eating fatty foods. °· You have a fever. °SEEK IMMEDIATE MEDICAL CARE IF: °· Your pain does not go away within 2 hours. °· You keep throwing up (vomiting). °· Your pain is felt only in portions of the abdomen, such as the right side or the left lower portion of the abdomen. °· You pass bloody or black tarry stools. °MAKE SURE YOU: °· Understand these instructions. °· Will watch your condition. °· Will get help right away if you are not doing well or get worse. °  °This information is not intended to replace advice given to you by  your health care provider. Make sure you discuss any questions you have with your health care provider. °  °Document Released: 08/11/2005 Document Revised: 07/23/2015 Document Reviewed: 07/11/2013 °Elsevier Interactive Patient Education ©2016 [employer] ° °

## 2016-04-21 NOTE — ED Notes (Signed)
Pt reports had D&C April 13 for heavy bleeding and morena placed then. Had problems "body rejected the morena" and it was removed and she was placed on provera pills.  The bleed ins not heavy but painful passing large clots of blood and cramping/ abdominal pain. Pt reports sometimes fever and chills about 2 days ago.  Pt called ob/gyn but no appointments today and in severe pain.  Pt has been taking 1200 mg ibuprofen at a time 2x a day.

## 2016-06-02 ENCOUNTER — Encounter: Payer: Self-pay | Admitting: Emergency Medicine

## 2016-06-02 ENCOUNTER — Emergency Department
Admission: EM | Admit: 2016-06-02 | Discharge: 2016-06-02 | Disposition: A | Discharge status: Home / Self Care | Payer: Medicaid Other | Attending: Emergency Medicine | Admitting: Emergency Medicine

## 2016-06-02 DIAGNOSIS — D259 Leiomyoma of uterus, unspecified: Secondary | ICD-10-CM | POA: Diagnosis not present

## 2016-06-02 DIAGNOSIS — Z79899 Other long term (current) drug therapy: Secondary | ICD-10-CM | POA: Insufficient documentation

## 2016-06-02 DIAGNOSIS — G8929 Other chronic pain: Secondary | ICD-10-CM

## 2016-06-02 DIAGNOSIS — N938 Other specified abnormal uterine and vaginal bleeding: Secondary | ICD-10-CM | POA: Diagnosis present

## 2016-06-02 DIAGNOSIS — Z8719 Personal history of other diseases of the digestive system: Secondary | ICD-10-CM | POA: Insufficient documentation

## 2016-06-02 DIAGNOSIS — Z791 Long term (current) use of non-steroidal anti-inflammatories (NSAID): Secondary | ICD-10-CM | POA: Insufficient documentation

## 2016-06-02 DIAGNOSIS — F17210 Nicotine dependence, cigarettes, uncomplicated: Secondary | ICD-10-CM | POA: Insufficient documentation

## 2016-06-02 DIAGNOSIS — M1990 Unspecified osteoarthritis, unspecified site: Secondary | ICD-10-CM | POA: Diagnosis not present

## 2016-06-02 DIAGNOSIS — R102 Pelvic and perineal pain: Secondary | ICD-10-CM

## 2016-06-02 HISTORY — DX: Leiomyoma of uterus, unspecified: D25.9

## 2016-06-02 LAB — BASIC METABOLIC PANEL
Anion gap: 6 (ref 5–15)
BUN: 8 mg/dL (ref 6–20)
CALCIUM: 9.1 mg/dL (ref 8.9–10.3)
CO2: 25 mmol/L (ref 22–32)
CREATININE: 0.65 mg/dL (ref 0.44–1.00)
Chloride: 107 mmol/L (ref 101–111)
GFR calc Af Amer: >60 mL/min (ref >60)
GFR calc non Af Amer: >60 mL/min (ref >60)
GLUCOSE: 127 mg/dL — AB (ref 65–99)
Potassium: 3.7 mmol/L (ref 3.5–5.1)
Sodium: 138 mmol/L (ref 135–145)

## 2016-06-02 LAB — URINALYSIS COMPLETE WITH MICROSCOPIC (ARMC ONLY)
BACTERIA UA: NONE SEEN
BILIRUBIN URINE: NEGATIVE
GLUCOSE, UA: NEGATIVE mg/dL
Ketones, ur: NEGATIVE mg/dL
LEUKOCYTES UA: NEGATIVE
Nitrite: NEGATIVE
PH: 5 (ref 5.0–8.0)
Protein, ur: 30 mg/dL — AB
SPECIFIC GRAVITY, URINE: 1.018 (ref 1.005–1.030)
WBC UA: NONE SEEN WBC/hpf (ref 0–5)

## 2016-06-02 LAB — CBC
HEMATOCRIT: 44.1 % (ref 35.0–47.0)
Hemoglobin: 14.8 g/dL (ref 12.0–16.0)
MCH: 34.4 pg — ABNORMAL HIGH (ref 26.0–34.0)
MCHC: 33.6 g/dL (ref 32.0–36.0)
MCV: 102.5 fL — AB (ref 80.0–100.0)
Platelets: 267 10*3/uL (ref 150–440)
RBC: 4.3 MIL/uL (ref 3.80–5.20)
RDW: 18.2 % — AB (ref 11.5–14.5)
WBC: 10.6 10*3/uL (ref 3.6–11.0)

## 2016-06-02 LAB — TYPE AND SCREEN
ABO/RH(D): O POS
Antibody Screen: NEGATIVE

## 2016-06-02 MED ORDER — OXYCODONE-ACETAMINOPHEN 5-325 MG PO TABS
1.0000 | ORAL_TABLET | Freq: Once | ORAL | Status: DC
  Filled 2016-06-02: qty 1

## 2016-06-02 MED ORDER — IBUPROFEN 400 MG PO TABS
600.0000 mg | ORAL_TABLET | Freq: Once | ORAL | Status: AC
  Administered 2016-06-02: 600 mg via ORAL
  Filled 2016-06-02: qty 2

## 2016-06-02 MED ORDER — OXYCODONE-ACETAMINOPHEN 5-325 MG PO TABS
1.0000 | ORAL_TABLET | Freq: Four times a day (QID) | ORAL | Status: AC | PRN
Start: 2016-06-02 — End: ?

## 2016-06-02 NOTE — ED Notes (Signed)
Pt to ED from home c/o abd pain and vaginal bleeding since April.  Pt states had mirena placed in April and removed in May due to it being misplaced and causing severe pain.  Pt states passing clots daily since April and abd pain described as labor pains.  Pt has appointment in August but could not wait until then.  Denies n/v/d.

## 2016-06-02 NOTE — ED Notes (Signed)
Pt states she has to drive home, daughter next door has pain medicine ordered as well, but pt states her daughter can take the pain medicine and the pt will not so she can drive home.

## 2016-06-02 NOTE — ED Notes (Addendum)
Passing vaginal clots daily , bleeding present since April , cramping/ contraction pain .has been evaluated for same, PCP follow up Aug 15, RX iron tabs x 11/2015

## 2016-06-02 NOTE — Discharge Instructions (Signed)
You were prescribed a medication that is potentially sedating. Do not drink alcohol, drive or participate in any other potentially dangerous activities while taking this medication as it may make you sleepy. Do not take this medication with any other sedating medications, either prescription or over-the-counter. If you were prescribed Percocet or Vicodin, do not take these with acetaminophen (Tylenol) as it is already contained within these medications.   Opioid pain medications (or "narcotics") can be habit forming.  Use it as little as possible to achieve adequate pain control.  Do not use or use it with extreme caution if you have a history of opiate abuse or dependence.  If you are on a pain contract with your primary care doctor or a pain specialist, be sure to let them know you were prescribed this medication today from the [facility].  This medication is intended for your use only - do not give any to anyone else and keep it in a secure place where nobody else, especially children and pets, have access to it.  It will also cause or worsen constipation, so you may want to consider taking an over-the-counter stool softener while you are taking this medication.  Abnormal Uterine Bleeding Abnormal uterine bleeding can affect women at various stages in life, including teenagers, women in their reproductive years, pregnant women, and women who have reached menopause. Several kinds of uterine bleeding are considered abnormal, including:  Bleeding or spotting between periods.   Bleeding after sexual intercourse.   Bleeding that is heavier or more than normal.   Periods that last longer than usual.  Bleeding after menopause.  Many cases of abnormal uterine bleeding are minor and simple to treat, while others are more serious. Any type of abnormal bleeding should be evaluated by your health care provider. Treatment will depend on the cause of the bleeding. HOME CARE  INSTRUCTIONS Monitor your condition for any changes. The following actions may help to alleviate any discomfort you are experiencing:  Avoid the use of tampons and douches as directed by your health care provider.  Change your pads frequently. You should get regular pelvic exams and Pap tests. Keep all follow-up appointments for diagnostic tests as directed by your health care provider.  SEEK MEDICAL CARE IF:   Your bleeding lasts more than 1 week.   You feel dizzy at times.  SEEK IMMEDIATE MEDICAL CARE IF:   You pass out.   You are changing pads every 15 to 30 minutes.   You have abdominal pain.  You have a fever.   You become sweaty or weak.   You are passing large blood clots from the vagina.   You start to feel nauseous and vomit. MAKE SURE YOU:   Understand these instructions.  Will watch your condition.  Will get help right away if you are not doing well or get worse.   This information is not intended to replace advice given to you by your health care provider. Make sure you discuss any questions you have with your health care provider.   Document Released: 11/01/2005 Document Revised: 11/06/2013 Document Reviewed: 05/31/2013 Elsevier Interactive Patient Education [employer].

## 2016-06-02 NOTE — ED Provider Notes (Signed)
[facility] Emergency Department Provider Note  ____________________________________________  Time seen: 3:00 PM  I have reviewed the triage vital signs and the nursing notes.   HISTORY  Chief Complaint Abdominal Pain and Vaginal Bleeding    HPI Haley Jackson is a 50 y.o. female who complains of chronic right-sided pelvic pain and vaginal bleeding for the past 3 months. She is seen gynecology as well as been evaluated in the [facility] emergency room for this. Has had multiple ultrasounds showing uterine fibroids and nothing else. Her gynecologist Dr. Star Age has her taking Provera which is improving the symptoms but they have not resolved. Denies any chest pain shortness of breath fever or chills. No dizziness or syncope. No new symptoms.   Past Medical History  Diagnosis Date  . Arthritis   . 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 Hepatitis     YEARS AGO AFTER EATING CONTAMINATED FOOD  . Fibromyalgia   . Anemia 2017    due to recent bleeding and an ulcer  . GERD (gastroesophageal reflux disease)     11/2015 diagnosed with bleeding ulcer....taking prilosec  . Fibroid, uterine      There are no active problems to display for this patient.    Past Surgical History  Procedure Laterality Date  . Cholecystectomy    . Pilonidal cyst excision    . Hysteroscopy w/d&c N/A 02/26/2016    Procedure: DILATATION AND CURETTAGE /HYSTEROSCOPY;  Surgeon: Malachy Mood, MD;  Location: [facility] ORS;  Service: Gynecology;  Laterality: N/A;  . Intrauterine device (iud) insertion N/A 02/26/2016    Procedure: INTRAUTERINE DEVICE (IUD) INSERTION;  Surgeon: Malachy Mood, MD;  Location: [facility] ORS;  Service: Gynecology;  Laterality: N/A;  . Iud removal       Current Outpatient Rx  Name  Route  Sig  Dispense  Refill  .  Cholecalciferol (VITAMIN D PO)   Oral   Take 1 tablet by mouth once a week.         . cyclobenzaprine (FLEXERIL) 10 MG tablet   Oral   Take 10 mg by mouth as needed for muscle spasms.         . diphenhydrAMINE (BENADRYL) 25 MG tablet   Oral   Take 25 mg by mouth every 6 (six) hours as needed (starts with 2 tabs and drops to 1).         . DULoxetine (CYMBALTA) 30 MG capsule   Oral   Take 30 mg by mouth every morning.         Marland Kitchen HYDROcodone-acetaminophen (NORCO/VICODIN) 5-325 MG tablet   Oral   Take 1 tablet by mouth every 6 (six) hours as needed.   30 tablet   0   . ibuprofen (ADVIL,MOTRIN) 600 MG tablet   Oral   Take 1 tablet (600 mg total) by mouth every 6 (six) hours as needed.   60 tablet   3   . IRON PO   Oral   Take 1 tablet by mouth daily.         Marland Kitchen omeprazole (PRILOSEC) 20 MG capsule   Oral   Take 20 mg by mouth every morning.         Marland Kitchen oxyCODONE-acetaminophen (ROXICET) 5-325 MG tablet   Oral   Take 1 tablet by mouth every 6 (six) hours as needed for  severe pain.   12 tablet   0      Allergies Ampicillin; Penicillins; Tetracyclines & related; and Sulfa antibiotics   History reviewed. No pertinent family history.  Social History Social History  Substance Use Topics  . Smoking status: Current Every Day Smoker -- 0.25 packs/day for 30 years    Types: Cigarettes  . Smokeless tobacco: None  . Alcohol Use: Yes     Comment: OCC    Review of Systems  Constitutional:   No fever or chills.  ENT:   No sore throat. No rhinorrhea. Cardiovascular:   No chest pain. Respiratory:   No dyspnea or cough. Gastrointestinal:   Negative for abdominal pain, vomiting and diarrhea.  Genitourinary:   Chronic pelvic pain and vaginal bleeding. Musculoskeletal:   Negative for focal pain or swelling Neurological:   Negative for headaches 10-point ROS otherwise negative.  ____________________________________________   PHYSICAL EXAM:  VITAL SIGNS: ED  Triage Vitals  Enc Vitals Group     BP 06/02/16 1317 147/93 mmHg     Pulse Rate 06/02/16 1317 87     Resp 06/02/16 1317 20     Temp 06/02/16 1317 98.4 F (36.9 C)     Temp Source 06/02/16 1317 Oral     SpO2 06/02/16 1317 100 %     Weight 06/02/16 1317 360 lb (163.295 kg)     Height 06/02/16 1317 5\' 5"  (1.651 m)     Head Cir --      Peak Flow --      Pain Score 06/02/16 1322 9     Pain Loc --      Pain Edu? --      Excl. in [place]? --     Vital signs reviewed, nursing assessments reviewed.   Constitutional:   Alert and oriented. Well appearing and in no distress. Eyes:   No scleral icterus. No conjunctival pallor. PERRL. EOMI.  ENT   Head:   Normocephalic and atraumatic.   Nose:   No congestion/rhinnorhea. No septal hematoma   Mouth/Throat:   MMM, no pharyngeal erythema. No peritonsillar mass.    Neck:   No stridor. No SubQ emphysema. No meningismus. Hematological/Lymphatic/Immunilogical:   No cervical lymphadenopathy. Cardiovascular:   RRR. Symmetric bilateral radial and DP pulses.  No murmurs.  Respiratory:   Normal respiratory effort without tachypnea nor retractions. Breath sounds are clear and equal bilaterally. No wheezes/rales/rhonchi. Gastrointestinal:   Soft with mild suprapubic tenderness. Non distended. There is no CVA tenderness.  No rebound, rigidity, or guarding. Genitourinary:   deferred Musculoskeletal:   Nontender with normal range of motion in all extremities. No joint effusions.  No lower extremity tenderness.  No edema. Neurologic:   Normal speech and language.  CN 2-10 normal. Motor grossly intact. No gross focal neurologic deficits are appreciated.  Skin:    Skin is warm, dry and intact. No rash noted.  No petechiae, purpura, or bullae.  ____________________________________________    LABS (pertinent positives/negatives) (all labs ordered are listed, but only abnormal results are displayed) Labs Reviewed  CBC - Abnormal; Notable for the  following:    MCV 102.5 (*)    MCH 34.4 (*)    RDW 18.2 (*)    All other components within normal limits  BASIC METABOLIC PANEL - Abnormal; Notable for the following:    Glucose, Bld 127 (*)    All other components within normal limits  URINALYSIS COMPLETEWITH MICROSCOPIC ([facility] ONLY) - Abnormal; Notable for the following:    Color,  Urine YELLOW (*)    APPearance CLEAR (*)    Hgb urine dipstick 3+ (*)    Protein, ur 30 (*)    Squamous Epithelial / LPF 0-5 (*)    All other components within normal limits  TYPE AND SCREEN   ____________________________________________   EKG    ____________________________________________    RADIOLOGY    ____________________________________________   PROCEDURES   ____________________________________________   INITIAL IMPRESSION / ASSESSMENT AND PLAN / ED COURSE  Pertinent labs & imaging results that were available during my care of the patient were reviewed by me and considered in my medical decision making (see chart for details).  Patient presents chronic pelvic pain and vaginal bleeding, managed by gynecology. Ultrasounds of demonstrated large uterine fibroids measuring up to 4 cm, multiple. I do believe that this is the cause for her chronic symptoms that is likely symptomatic, should she should continue following up with gynecology for continued medical management possible surgical planning if necessary. Labs are normal currently.Considering the patient's symptoms, medical history, and physical examination today, I have low suspicion for cholecystitis or biliary pathology, pancreatitis, perforation or bowel obstruction, hernia, intra-abdominal abscess, AAA or dissection, volvulus or intussusception, mesenteric ischemia, or appendicitis.  Low suspicion for STI PiD torsion or ectopic.     ____________________________________________   FINAL CLINICAL IMPRESSION(S) / ED DIAGNOSES  Final diagnoses:  Chronic pelvic pain in female   DUB (dysfunctional uterine bleeding)  Uterine leiomyoma, unspecified location       Portions of this note were generated with dragon dictation software. Dictation errors may occur despite best attempts at proofreading.   Carrie Mew, MD 06/02/16 1556

## 2016-12-28 MED ORDER — WHITE PETROLATUM GEL
Status: AC
  Filled 2016-12-28: qty 5

## 2018-12-14 ENCOUNTER — Other Ambulatory Visit: Payer: Self-pay | Admitting: Unknown Physician Specialty

## 2018-12-14 DIAGNOSIS — Z1231 Encounter for screening mammogram for malignant neoplasm of breast: Secondary | ICD-10-CM
